# Patient Record
Sex: Male | Born: 2000 | Race: Black or African American | Hispanic: No | Marital: Single | Smoking: Never smoker
Health system: Southern US, Community
[De-identification: ages and names within clinical notes are randomized; demographics above are authoritative.]

---

## 2021-06-27 ENCOUNTER — Encounter (HOSPITAL_COMMUNITY): Payer: Self-pay | Admitting: *Deleted

## 2021-06-27 ENCOUNTER — Emergency Department (HOSPITAL_COMMUNITY)
Admission: EM | Admit: 2021-06-27 | Discharge: 2021-06-27 | Discharge status: Against Medical Advice | Payer: Self-pay | Attending: Emergency Medicine | Admitting: Emergency Medicine

## 2021-06-27 ENCOUNTER — Other Ambulatory Visit: Payer: Self-pay

## 2021-06-27 DIAGNOSIS — M542 Cervicalgia: Secondary | ICD-10-CM | POA: Diagnosis not present

## 2021-06-27 DIAGNOSIS — Z5321 Procedure and treatment not carried out due to patient leaving prior to being seen by health care provider: Secondary | ICD-10-CM | POA: Insufficient documentation

## 2021-06-27 DIAGNOSIS — Y92410 Unspecified street and highway as the place of occurrence of the external cause: Secondary | ICD-10-CM | POA: Diagnosis not present

## 2021-06-27 DIAGNOSIS — M549 Dorsalgia, unspecified: Secondary | ICD-10-CM | POA: Diagnosis not present

## 2021-06-27 DIAGNOSIS — R519 Headache, unspecified: Secondary | ICD-10-CM | POA: Diagnosis not present
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
## 2021-06-27 NOTE — ED Notes (Signed)
Called pt for vitals, no response. Placing in OTF
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
## 2021-06-27 NOTE — ED Notes (Signed)
Called pt for vitals, no response.

## 2021-07-07 ENCOUNTER — Other Ambulatory Visit: Payer: Self-pay

## 2021-07-07 ENCOUNTER — Emergency Department (HOSPITAL_COMMUNITY)
Admission: EM | Admit: 2021-07-07 | Discharge: 2021-07-07 | Disposition: A | Discharge status: Home / Self Care | Payer: Self-pay | Attending: Emergency Medicine | Admitting: Emergency Medicine

## 2021-07-07 DIAGNOSIS — Y929 Unspecified place or not applicable: Secondary | ICD-10-CM | POA: Insufficient documentation

## 2021-07-07 DIAGNOSIS — Y999 Unspecified external cause status: Secondary | ICD-10-CM | POA: Insufficient documentation

## 2021-07-07 DIAGNOSIS — Y939 Activity, unspecified: Secondary | ICD-10-CM | POA: Insufficient documentation

## 2021-07-07 DIAGNOSIS — M542 Cervicalgia: Secondary | ICD-10-CM | POA: Insufficient documentation

## 2021-07-07 DIAGNOSIS — M546 Pain in thoracic spine: Secondary | ICD-10-CM | POA: Insufficient documentation

## 2021-07-07 MED ORDER — IBUPROFEN 800 MG PO TABS: 800.0000 mg | ORAL_TABLET | Freq: Three times a day (TID) | ORAL | 0 refills | Status: DC
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
## 2021-07-07 NOTE — Discharge Instructions (Addendum)
Please follow-up with the sports medicine doctor.  You may need to have physical therapy.  This is something that must be set up in the office, not in the ER.  Take medications as prescribed.
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
## 2022-03-19 ENCOUNTER — Encounter (HOSPITAL_COMMUNITY): Payer: Self-pay

## 2022-03-19 ENCOUNTER — Other Ambulatory Visit: Payer: Self-pay

## 2022-03-19 ENCOUNTER — Ambulatory Visit (INDEPENDENT_AMBULATORY_CARE_PROVIDER_SITE_OTHER): Payer: 59

## 2022-03-19 ENCOUNTER — Ambulatory Visit (HOSPITAL_COMMUNITY)
Admission: RE | Admit: 2022-03-19 | Discharge: 2022-03-19 | Disposition: A | Discharge status: Home / Self Care | Payer: 59 | Source: Ambulatory Visit | Attending: Nurse Practitioner | Admitting: Nurse Practitioner

## 2022-03-19 VITALS — BP 151/83 | HR 65 | Temp 97.9°F | Resp 20

## 2022-03-19 DIAGNOSIS — M25562 Pain in left knee: Secondary | ICD-10-CM

## 2022-03-19 DIAGNOSIS — M7652 Patellar tendinitis, left knee: Secondary | ICD-10-CM | POA: Diagnosis not present

## 2022-03-19 DIAGNOSIS — M7989 Other specified soft tissue disorders: Secondary | ICD-10-CM | POA: Diagnosis not present

## 2022-03-19 MED ORDER — IBUPROFEN 800 MG PO TABS: 800.0000 mg | ORAL_TABLET | Freq: Three times a day (TID) | ORAL | 0 refills | Status: AC | PRN

## 2022-03-19 NOTE — ED Triage Notes (Signed)
Pt presents with c/o L knee pain x 3 weeks  ?Pt states he injured it while jumping during a sport.  ? ?States he has taken tylenol and denies relief.  ? ?Pt reports putting pressure on the knee causes pain.  ?

## 2022-03-19 NOTE — Discharge Instructions (Addendum)
Your x-rays are negative for dislocation or fracture.  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 ?You have been provided a sleeve for your knee to help with compression, support, and stability.   ?May take over-the-counter Tylenol or ibuprofen as needed for pain or discomfort. ?You will need to follow-up with orthopedics.  You can contact [facility] at [phone] or [facility] at [phone].  You should follow-up within the next 1 to 2 weeks for further evaluation. ?Follow-up as needed. ? ?

## 2022-03-19 NOTE — ED Provider Notes (Signed)
?[facility] ? ? ? ?CSN: [id number] ?Arrival date & time: 03/19/22  1640 ? ? ?  ? ?History   ?Chief Complaint ?Chief Complaint  ?Patient presents with  ? Knee Pain  ?  APPT   ? ? ?HPI ?Nicholas Brennan is a 21 y.o. male.  ? ?The patient is a 21 year old male who presents with left knee pain.  Symptoms have been present for the past 2 weeks.  Patient states he was playing basketball, went up for rebound and came down wrong.  He did not fall on the knee.  Since that time, he has had swelling, which has since resolved.  He continues to experience pain in the anterior aspect of the knee.  Pain worsens with prolonged walking, and weightbearing.  He does admit that at times he cannot bear weight on the knee and that he experiences intermittent weakness and instability.  He denies numbness, tingling, or radiation of pain.  He states that he has not noticed any swelling since the initial swelling that he experienced at the time of the injury.  He denies any previous injury to the left knee.  States he has not been taking any medication for his symptoms. ? ? ?Knee Pain ?Location:  Knee ?Pain details:  ?  Radiates to:  Does not radiate ?  Progression:  Waxing and waning ?Chronicity:  New ?Dislocation: no   ?Worsened by:  Bearing weight, flexion and extension ?Ineffective treatments:  None tried ?Associated symptoms: swelling   ?Associated symptoms: no numbness, no stiffness and no tingling   ?Risk factors: no frequent fractures   ? ?History reviewed. No pertinent past medical history. ? ?There are no problems to display for this patient. ? ? ?History reviewed. No pertinent surgical history. ? ? ? ? ?Home Medications   ? ?Prior to Admission medications   ?Medication Sig Start Date End Date Taking? Authorizing Provider  ?ibuprofen (ADVIL) 800 MG tablet Take 1 tablet (800 mg total) by mouth 3 (three) times daily. 07/07/21   Roxy Horseman, PA-C  ? ? ?Family History ?History reviewed. No pertinent family  history. ? ?Social History ?Social History  ? ?Tobacco Use  ? Smoking status: Never  ? Smokeless tobacco: Never  ?Substance Use Topics  ? Alcohol use: Yes  ? Drug use: Never  ? ? ? ?Allergies   ?Patient has no known allergies. ? ? ?Review of Systems ?Review of Systems  ?Constitutional: Negative.   ?Musculoskeletal:  Negative for stiffness.  ?     Left knee pain  ?Skin: Negative.   ?Psychiatric/Behavioral: Negative.    ? ? ?Physical Exam ?Triage Vital Signs ?ED Triage Vitals  ?Enc Vitals Group  ?   BP 03/19/22 1719 (!) 151/83  ?   Pulse Rate 03/19/22 1718 65  ?   Resp 03/19/22 1718 20  ?   Temp 03/19/22 1718 97.9 ?F (36.6 ?C)  ?   Temp Source 03/19/22 1718 Oral  ?   SpO2 03/19/22 1718 100 %  ?   Weight --   ?   Height --   ?   Head Circumference --   ?   Peak Flow --   ?   Pain Score 03/19/22 1716 4  ?   Pain Loc --   ?   Pain Edu? --   ?   Excl. in GC? --   ? ?No data found. ? ?Updated Vital Signs ?BP (!) 151/83 (BP Location: Right Arm)   Pulse 65   Temp 97.9 ?  F (36.6 ?C) (Oral)   Resp 20   SpO2 100%  ? ?Visual Acuity ?Right Eye Distance:   ?Left Eye Distance:   ?Bilateral Distance:   ? ?Right Eye Near:   ?Left Eye Near:    ?Bilateral Near:    ? ?Physical Exam ?Vitals reviewed.  ?Constitutional:   ?   General: He is not in acute distress. ?   Appearance: Normal appearance.  ?Pulmonary:  ?   Effort: Pulmonary effort is normal.  ?   Breath sounds: Normal breath sounds.  ?Musculoskeletal:  ?   Left knee: No swelling, deformity, erythema or crepitus. Normal range of motion. Tenderness present over the patellar tendon. No MCL, LCL, ACL or PCL tenderness. Normal patellar mobility.  ?   Instability Tests: Anterior drawer test negative. Posterior drawer test negative.  ?Skin: ?   General: Skin is warm and dry.  ?Neurological:  ?   Mental Status: He is alert.  ? ? ? ?UC Treatments / Results  ?Labs ?(all labs ordered are listed, but only abnormal results are displayed) ?Labs Reviewed - No data to  display ? ?EKG ? ? ?Radiology ?DG Knee Complete 4 Views Left ? ?Result Date: 03/19/2022 ?CLINICAL DATA:  Knee injury playing basketball 2 weeks ago. Injured jumping. EXAM: LEFT KNEE - COMPLETE 4+ VIEW COMPARISON:  None. FINDINGS: Normal bone mineralization. Mild joint effusion. There is a chronic well corticated ossicle measuring 15 mm in craniocaudal dimension of the inferior aspect of the patella on lateral view. Mild soft tissue swelling anterior to the patellar tendon diffusely. No acute fracture is seen. No dislocation. IMPRESSION: There is chronic ossification at the proximal patellar tendon origin. This likely reflects the sequela of remote chronic traction change (Jumper's knee). Electronically Signed   By: Neita Garnetonald  Viola M.D.   On: 03/19/2022 18:32   ? ?Procedures ?Procedures (including critical care time) ? ?Medications Ordered in UC ?Medications - No data to display ? ?Initial Impression / Assessment and Plan / UC Course  ?I have reviewed the triage vital signs and the nursing notes. ? ?Pertinent labs & imaging results that were available during my care of the patient were reviewed by me and considered in my medical decision making (see chart for details). ? ?The patient is a 21 year old male who presents with left knee pain.  Patient states symptoms started after he was playing basketball about 2 weeks ago when he came down wrong on his knee.  Since that time he has had intermittent pain, weakness, intermittent ability to bear weight, and some mild instability.  Patient denies any previous injuries to the left knee.  Discussed with patient that an x-ray would only display fracture or dislocation.  X-ray was performed, which showed no fracture or dislocation.  Patient's x-ray does indicate a chronic traction change called "jumper's knee".  Patient instructed that he would need to follow-up with orthopedics for further evaluation to rule out tendon or ligamental injury.  In the interim, I am going to provide  the patient with a compression sleeve for his right knee to help with his weakness and instability.  We [facility] follow[facility]up.  Patient educated about RICE therapy.  Recommend he apply ice for 20 minutes on, off 1 hour, then repeat.  May take Ibuprofen as needed for pain or discomfort. Follow-up as needed. ?Final Clinical Impressions(s) / UC Diagnoses  ? ?Final diagnoses:  ?Acute pain of left knee  ?Jumper's knee of left side  ? ? ? ?  Discharge Instructions   ? ?  ?Your x-rays are negative for dislocation or fracture.  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 ?You have been provided a sleeve for your knee to help with compression, support, and stability.   ?May take over-the-counter Tylenol or ibuprofen as needed for pain or discomfort. ?You will need to follow-up with orthopedics.  You can contact [facility] at [phone] or [facility] at [phone].  You should follow-up within the next 1 to 2 weeks for further evaluation. ?Follow-up as needed. ? ? ? ? ? ?ED Prescriptions   ?None ?  ? ?PDMP not reviewed this encounter. ?  ?Abran Cantor, NP ?03/19/22 1846 ? ?

## 2023-03-14 IMAGING — DX DG KNEE COMPLETE 4+V*L*
4 series · 4 of 4 positions shown · non-contrast
Comparison: None.

CLINICAL DATA: Knee injury playing basketball 2 weeks ago. Injured
jumping.

EXAM:
LEFT KNEE - COMPLETE 4+ VIEW

[knee ap]
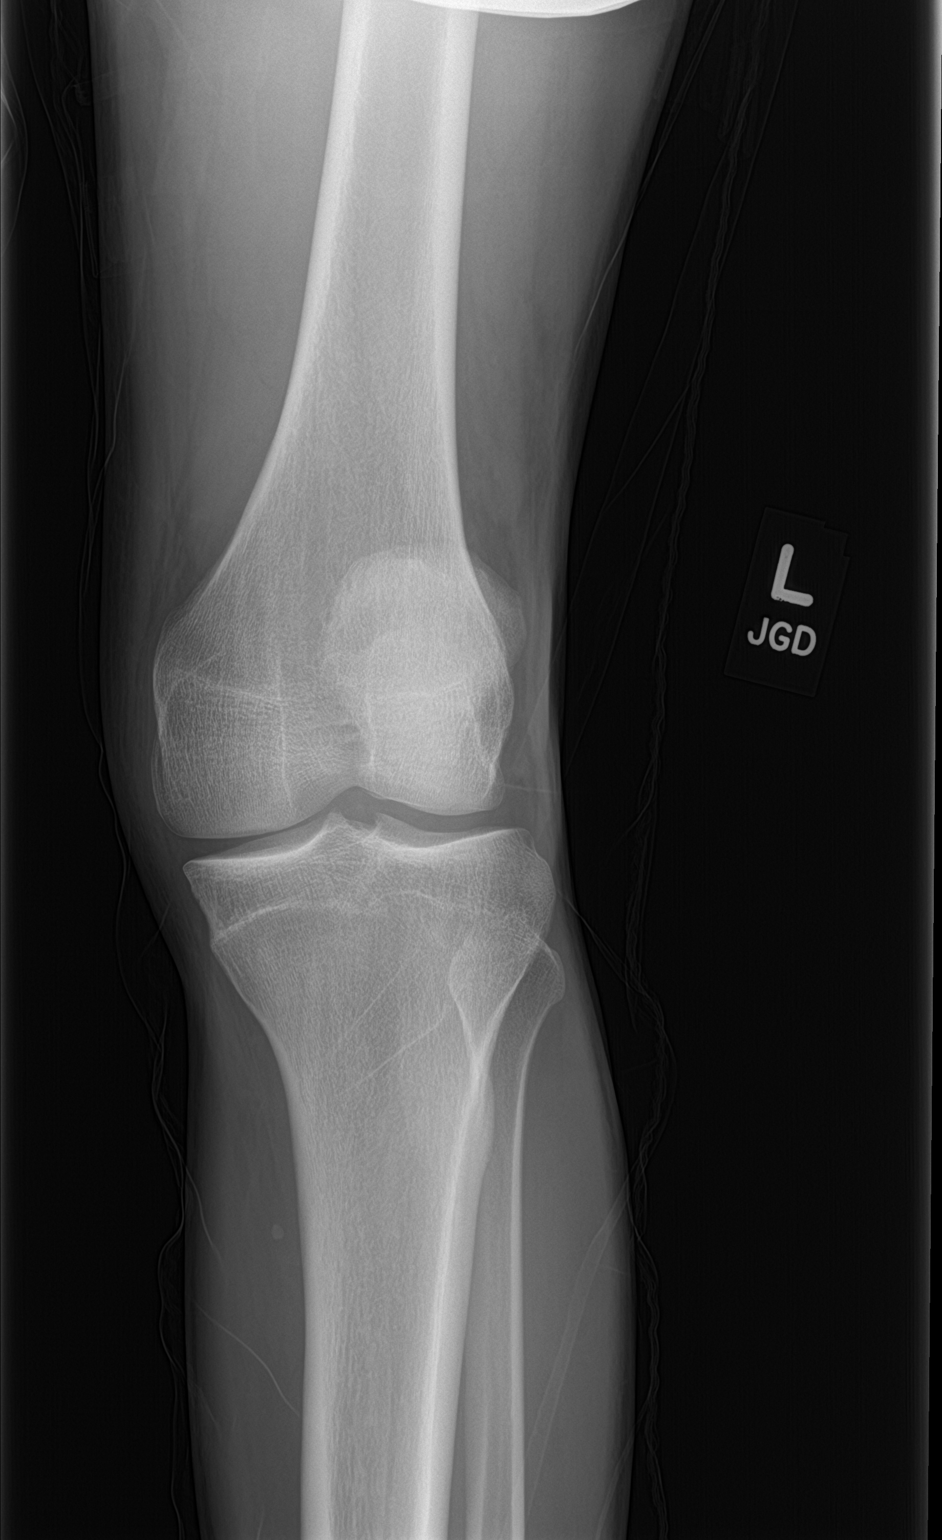

[knee obl (1 of 2)]
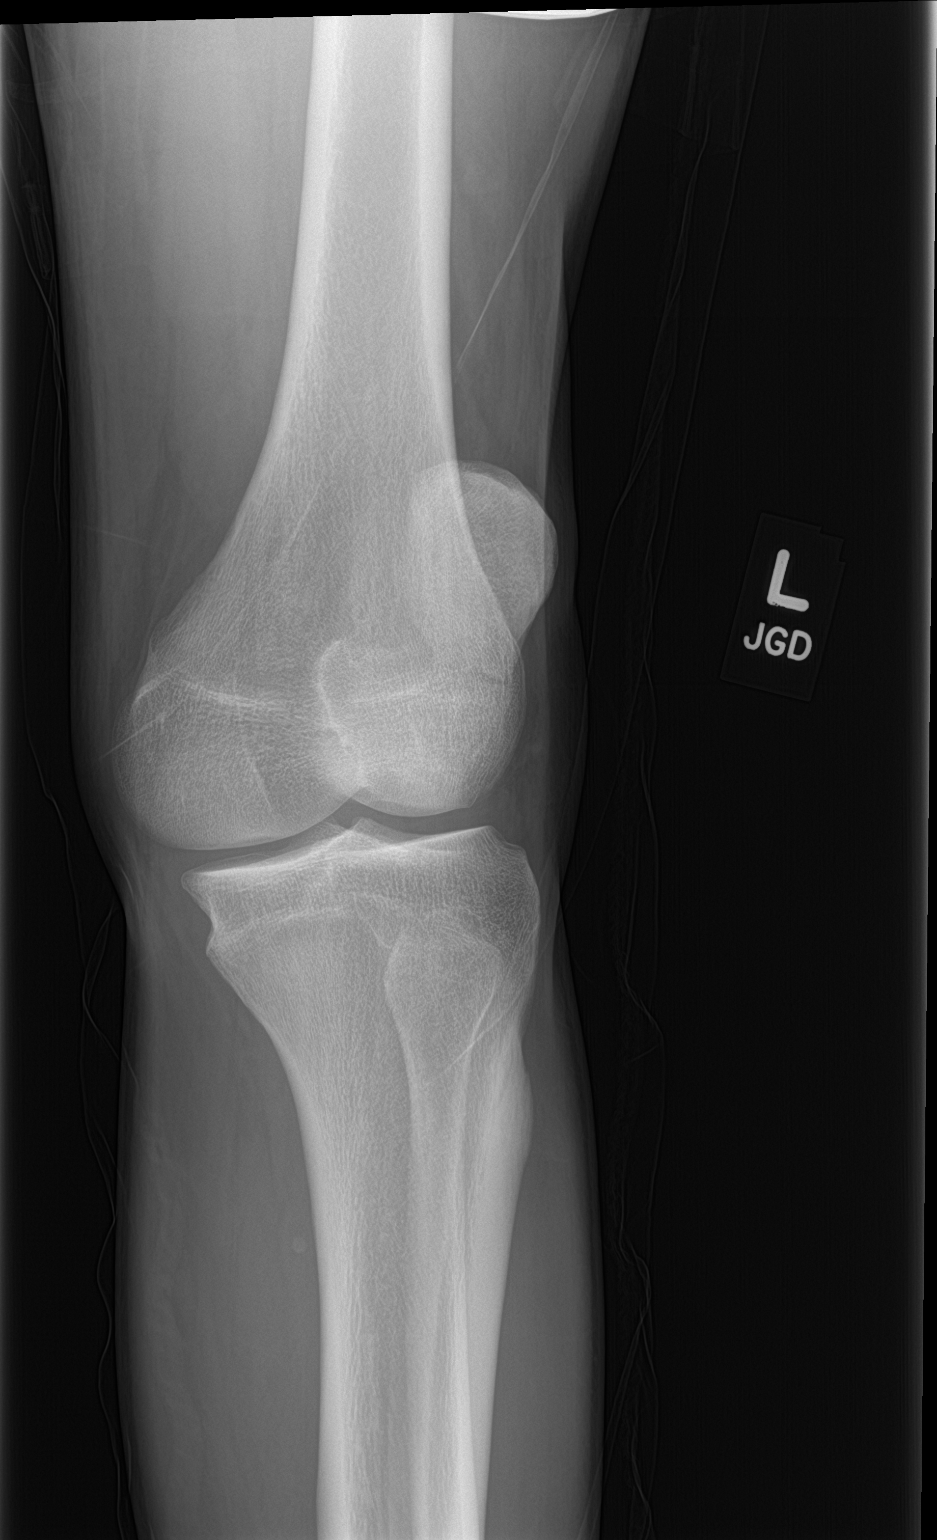

[knee obl (2 of 2)]
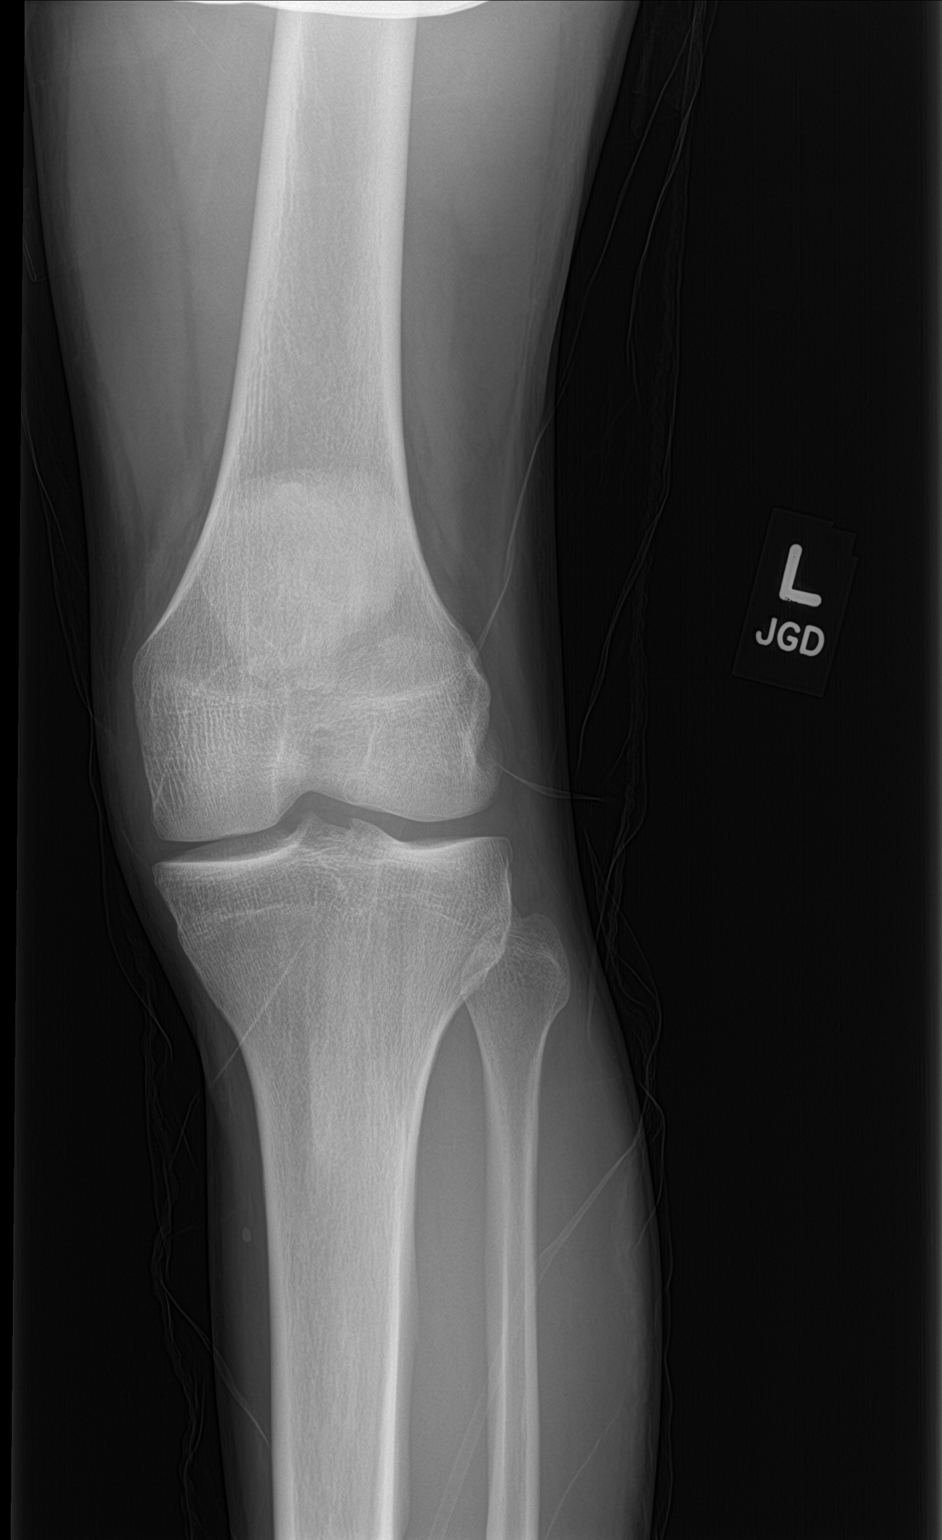

[knee lat]
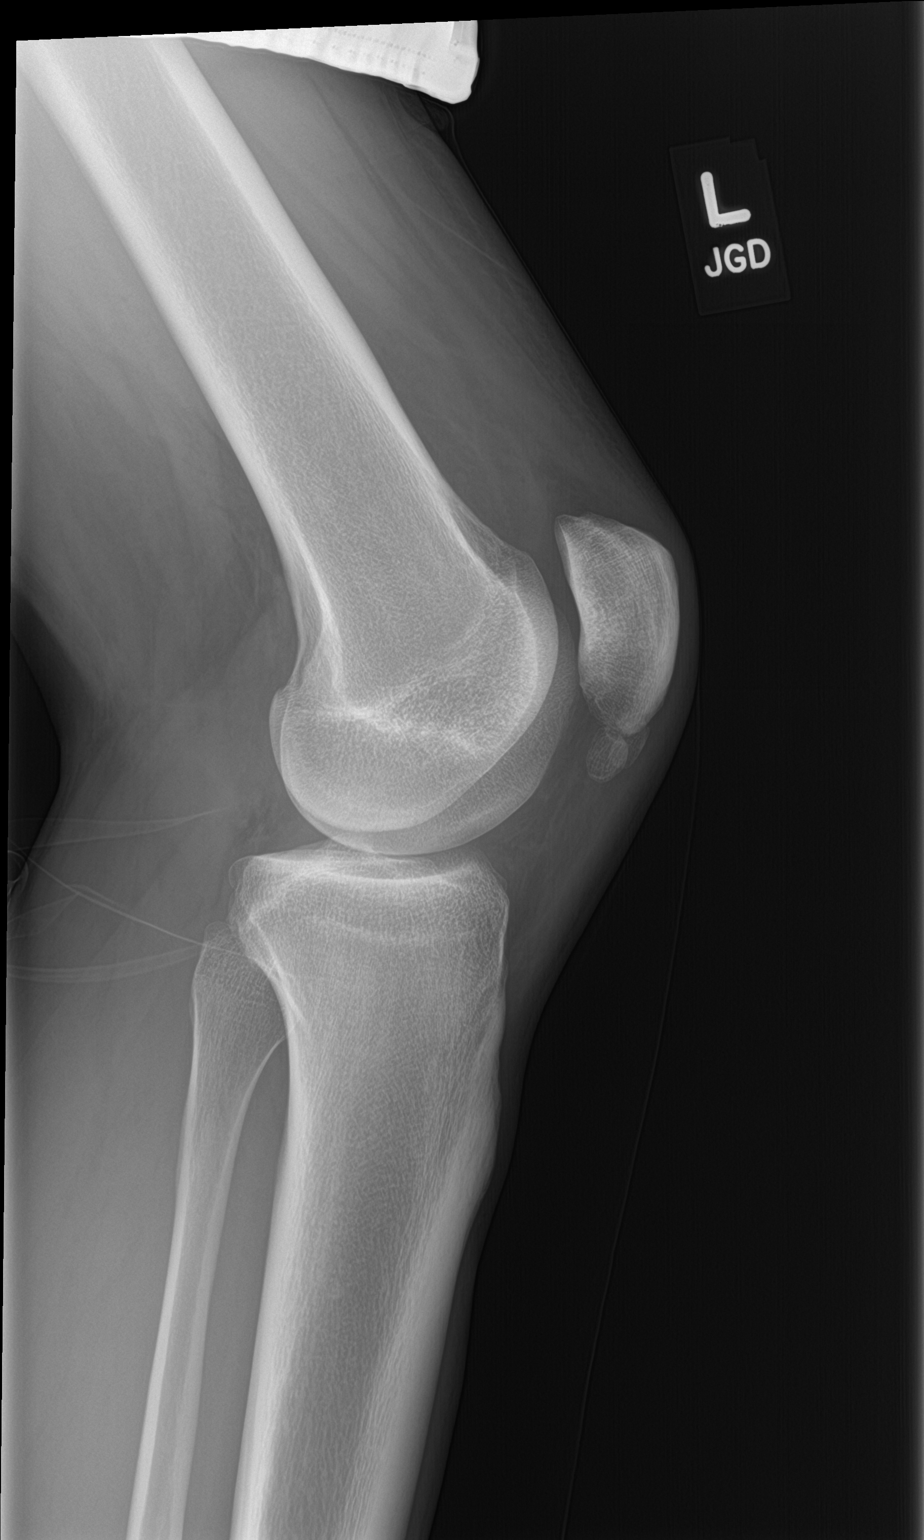

[4 of 4 positions shown; findings below may reference images not displayed]

FINDINGS: Normal bone mineralization. Mild joint effusion. There is a chronic
well corticated ossicle measuring 15 mm in craniocaudal dimension of
the inferior aspect of the patella on lateral view. Mild soft tissue
swelling anterior to the patellar tendon diffusely. No acute
fracture is seen. No dislocation.
IMPRESSION: There is chronic ossification at the proximal patellar tendon
origin. This likely reflects the sequela of remote chronic traction
change (Jumper's knee).

## 2023-12-16 ENCOUNTER — Emergency Department (HOSPITAL_COMMUNITY): Payer: Worker's Compensation

## 2023-12-16 ENCOUNTER — Emergency Department (HOSPITAL_COMMUNITY)
Admission: EM | Admit: 2023-12-16 | Discharge: 2023-12-16 | Disposition: A | Discharge status: Home / Self Care | Payer: Worker's Compensation | Attending: Emergency Medicine | Admitting: Emergency Medicine

## 2023-12-16 ENCOUNTER — Other Ambulatory Visit: Payer: Self-pay

## 2023-12-16 ENCOUNTER — Encounter (HOSPITAL_COMMUNITY): Payer: Self-pay

## 2023-12-16 DIAGNOSIS — S0181XA Laceration without foreign body of other part of head, initial encounter: Secondary | ICD-10-CM | POA: Diagnosis not present

## 2023-12-16 DIAGNOSIS — R220 Localized swelling, mass and lump, head: Secondary | ICD-10-CM | POA: Diagnosis not present

## 2023-12-16 DIAGNOSIS — H1132 Conjunctival hemorrhage, left eye: Secondary | ICD-10-CM | POA: Diagnosis not present

## 2023-12-16 DIAGNOSIS — S0512XA Contusion of eyeball and orbital tissues, left eye, initial encounter: Secondary | ICD-10-CM | POA: Insufficient documentation

## 2023-12-16 DIAGNOSIS — S0219XA Other fracture of base of skull, initial encounter for closed fracture: Secondary | ICD-10-CM | POA: Insufficient documentation

## 2023-12-16 DIAGNOSIS — S0285XA Fracture of orbit, unspecified, initial encounter for closed fracture: Secondary | ICD-10-CM | POA: Diagnosis not present

## 2023-12-16 DIAGNOSIS — S0990XA Unspecified injury of head, initial encounter: Secondary | ICD-10-CM | POA: Diagnosis present

## 2023-12-16 LAB — BASIC METABOLIC PANEL
Anion gap: 10 (ref 5–15)
BUN: 16 mg/dL (ref 6–20)
CO2: 25 mmol/L (ref 22–32)
Calcium: 9.1 mg/dL (ref 8.9–10.3)
Chloride: 101 mmol/L (ref 98–111)
Creatinine, Ser: 0.81 mg/dL (ref 0.61–1.24)
GFR, Estimated: >60 mL/min (ref >60)
Glucose, Bld: 99 mg/dL (ref 70–99)
Potassium: 3.4 mmol/L — ABNORMAL LOW (ref 3.5–5.1)
Sodium: 136 mmol/L (ref 135–145)

## 2023-12-16 LAB — CBC
HCT: 44.4 % (ref 39.0–52.0)
Hemoglobin: 14.6 g/dL (ref 13.0–17.0)
MCH: 31.5 pg (ref 26.0–34.0)
MCHC: 32.9 g/dL (ref 30.0–36.0)
MCV: 95.7 fL (ref 80.0–100.0)
Platelets: 228 10*3/uL (ref 150–400)
RBC: 4.64 MIL/uL (ref 4.22–5.81)
RDW: 12.7 % (ref 11.5–15.5)
WBC: 9.8 10*3/uL (ref 4.0–10.5)
nRBC: 0 % (ref 0.0–0.2)

## 2023-12-16 MED ORDER — HYDROCODONE-ACETAMINOPHEN 5-325 MG PO TABS
1.0000 | ORAL_TABLET | Freq: Once | ORAL | Status: AC
  Administered 2023-12-16: 1 via ORAL
  Filled 2023-12-16: qty 1

## 2023-12-16 MED ORDER — OXYCODONE HCL 5 MG PO TABS: 5.0000 mg | ORAL_TABLET | Freq: Four times a day (QID) | ORAL | 0 refills | Status: AC | PRN

## 2023-12-16 MED ORDER — FLUORESCEIN SODIUM 1 MG OP STRP
1.0000 | ORAL_STRIP | Freq: Once | OPHTHALMIC | Status: AC
  Administered 2023-12-16: 1 via OPHTHALMIC
  Filled 2023-12-16: qty 1

## 2023-12-16 MED ORDER — TETRACAINE HCL 0.5 % OP SOLN
2.0000 [drp] | Freq: Once | OPHTHALMIC | Status: AC
  Administered 2023-12-16: 2 [drp] via OPHTHALMIC
  Filled 2023-12-16: qty 4
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
